# Patient Record
Sex: Male | Born: 1935 | Race: White | Hispanic: No | Marital: Married | Smoking: Former smoker
Health system: Southern US, Community
[De-identification: ages and names within clinical notes are randomized; demographics above are authoritative.]

## PROBLEM LIST (undated history)

## (undated) DIAGNOSIS — G629 Polyneuropathy, unspecified: Secondary | ICD-10-CM

## (undated) DIAGNOSIS — M1990 Unspecified osteoarthritis, unspecified site: Secondary | ICD-10-CM

## (undated) DIAGNOSIS — I2510 Atherosclerotic heart disease of native coronary artery without angina pectoris: Secondary | ICD-10-CM

## (undated) DIAGNOSIS — K649 Unspecified hemorrhoids: Secondary | ICD-10-CM

## (undated) DIAGNOSIS — Z87442 Personal history of urinary calculi: Secondary | ICD-10-CM

## (undated) DIAGNOSIS — I10 Essential (primary) hypertension: Secondary | ICD-10-CM

## (undated) DIAGNOSIS — R238 Other skin changes: Secondary | ICD-10-CM

## (undated) DIAGNOSIS — R233 Spontaneous ecchymoses: Secondary | ICD-10-CM

## (undated) DIAGNOSIS — G4700 Insomnia, unspecified: Secondary | ICD-10-CM

## (undated) DIAGNOSIS — Z8489 Family history of other specified conditions: Secondary | ICD-10-CM

## (undated) DIAGNOSIS — E785 Hyperlipidemia, unspecified: Secondary | ICD-10-CM

## (undated) DIAGNOSIS — Z9289 Personal history of other medical treatment: Secondary | ICD-10-CM

## (undated) DIAGNOSIS — R51 Headache: Secondary | ICD-10-CM

## (undated) DIAGNOSIS — K5900 Constipation, unspecified: Secondary | ICD-10-CM

## (undated) DIAGNOSIS — H409 Unspecified glaucoma: Secondary | ICD-10-CM

## (undated) DIAGNOSIS — K219 Gastro-esophageal reflux disease without esophagitis: Secondary | ICD-10-CM

## (undated) DIAGNOSIS — J40 Bronchitis, not specified as acute or chronic: Secondary | ICD-10-CM

## (undated) HISTORY — PX: CYSTOSCOPY: SUR368

## (undated) HISTORY — PX: CARDIAC CATHETERIZATION: SHX172

## (undated) HISTORY — PX: SIGMOIDOSCOPY: SUR1295

## (undated) HISTORY — PX: CORONARY ANGIOPLASTY: SHX604

## (undated) HISTORY — PX: ESOPHAGOGASTRODUODENOSCOPY (EGD) WITH ESOPHAGEAL DILATION: SHX5812

## (undated) HISTORY — PX: VASECTOMY: SHX75

## (undated) HISTORY — PX: OTHER SURGICAL HISTORY: SHX169

---

## 1990-03-23 HISTORY — PX: OTHER SURGICAL HISTORY: SHX169

## 1990-03-23 HISTORY — PX: CERVICAL DISC SURGERY: SHX588

## 2007-04-22 ENCOUNTER — Ambulatory Visit (HOSPITAL_BASED_OUTPATIENT_CLINIC_OR_DEPARTMENT_OTHER): Admission: RE | Admit: 2007-04-22 | Discharge: 2007-04-22 | Payer: Self-pay | Admitting: Orthopaedic Surgery

## 2009-03-23 HISTORY — PX: OTHER SURGICAL HISTORY: SHX169

## 2010-08-05 NOTE — Op Note (Signed)
NAME:  Gerald Jones, Gerald Jones              ACCOUNT NO.:  [id number]   MEDICAL RECORD NO.:  [id number]          PATIENT TYPE:  AMB   LOCATION:  DSC                          FACILITY:  [facility]   PHYSICIAN:  Claude Manges. Whitfield, M.D.DATE OF BIRTH:  [birth year]   DATE OF PROCEDURE:  04/22/2007  DATE OF DISCHARGE:                               OPERATIVE REPORT   PREOPERATIVE DIAGNOSES:  1. Adhesive capsulitis, left shoulder.  2. Impingement with rotator cuff tear.  3. Degenerative joint disease, acromioclavicular joint.  4. Biceps tendinitis with biceps tear.   POSTOPERATIVE DIAGNOSES:  1. Adhesive capsulitis, left shoulder.  2. Impingement with rotator cuff tear.  3. Degenerative joint disease, acromioclavicular joint.  4. Biceps tendinitis with biceps tear.   PROCEDURES:  1. Manipulation, left shoulder.  2. Arthroscopic debridement, left shoulder joint.  3. Arthroscopic subacromial decompression.  4. Arthroscopic distal clavicle resection.  5. Mini open rotator cuff tear repair with biceps tenodesis.   SURGEON:  Claude Manges. Cleophas Dunker, M.D.   ASSISTANT:  Arlys John D. Petrarca, P.A.-C.   ANESTHESIA:  General with interscalene nerve block.   COMPLICATIONS:  None.   HISTORY:  A 77 year old gentleman has had a miserable time with his  left shoulder over a period of several months.  He has reached a point  where he cannot raise his arm above his head.  He can not sleep and that  side and basically has complete loss of the use of the shoulder because  of his pain.  He has had a recent MRI scan performed in [place],  [place], showing demonstrating a large joint effusion with a large  amount of fluid within the subacromial subdeltoid bursa suggestive of  bursitis, diffuse tendinopathy involving the supraspinatus tendon with  the possibility of a full-thickness tear.  There was also considerable  biceps tendinopathy.  The patient wishes to proceed with arthroscopic  evaluation.   PROCEDURE:  With  the patient comfortable on the operating table and  under general orotracheal anesthesia with a supplemental interscalene  nerve block, the patient was placed in a semi-sitting position with the  shoulder frame.  Examination of the left shoulder revealed considerable  loss of motion, allowing between 90 and 100 degrees of flexion and about  80 degrees of abduction.  Accordingly, manipulation was performed with  obvious lysis of adhesions.  Manipulation was performed in abduction,  flexion, internal and external rotation with near full range of overhead  motion.   The shoulder was then prepped with DuraPrep from the base of neck  circumferentially to below the elbow.  Sterile draping was performed.   A marking pen was used to outline the [facility] joint, the coracoid and the  acromion.  At a point a fingerbreadth posterior and medial to the  posterior angle of the acromion, a small stab wound was made and the  arthroscope easily placed into the shoulder joint.  There was a  hemarthrosis consistent with the manipulation.  There was considerable  synovitis and several longitudinal tears of the biceps tendon, which was  also significantly increased in girth.  There was at least partial  tearing of the cuff and I felt that I could see a small full-thickness  tear.  A second portal was established anteriorly and shaving of the  synovitis and fraying of the glenoid labrum was performed.  There were  some mild chondromalacia changes of the glenoid and the humerus.  Shaving of the partial rotator cuff tear and further synovitis was  performed.   The arthroscope was then placed in the subacromial space posteriorly,  the cannula in the subacromial space anteriorly and a third portal was  established in the lateral subacromial space.  An arthroscopic  subacromial decompression was performed.  There was considerable beefy  red bursal material as well as synovitis of the AC joint.  There was  overhang of  the acromion both laterally and anteriorly as well as at the  [facility] joint.  An anterior-inferior and a lateral acromioplasty was  performed with a 6-mm bur with a very nice decompression.  There was  obvious and significant anterior spurring.  The [facility] joint was obviously  arthritic and a distal clavicle resection was performed with a 6-mm bur  with very nice resection.   A mini open rotator cuff tear repair and biceps tenodesis was then  performed.  The incision was made along the anterior aspect of the  shoulder via sharp dissection and carried down through subcutaneous  tissue.  The deltoid fascia was identified, incised along one of its  raphes and then the fibers separated to visualize the subacromial space.  There was still some remaining bursal tissue, which I resected.  I  thought that the subacromial decompression was adequate.  There was an  obvious rotator cuff tear.  This was elliptically excised.  Beneath that  I could visualize the biceps tendon.  The tendon was tagged and then  incised from its attachment to the superior glenoid.  Under normal  tension, a tendon stump was then sutured with a single Mitek anchor to  the biceps groove.  It was supplemented by placing stitches through the  cuff into the tendon and through the other side of the cuff.  Any  further ragged edges of rotator cuff were then sharply excised and the  rotator cuff tear was then repaired from the humeral head to its apex  superiorly.  We had a nice repair and through a range of motion, there  was no impingement.  The wound was then copiously irrigated with saline  solution.  Deltoid fascia closed with a running 0 Vicryl, the subcu with  0 and 2-0 Vicryl, skin closed with Steri-Strips over Benzoin.  A sterile  bulky dressing was applied, followed by a sling.   PLAN:  Recovery care, discharge in a.m., and hydrocodone.  Office 1  week.      Claude Manges. Cleophas Dunker, M.D.  Electronically Signed     PWW/MEDQ   D:  04/22/2007  T:  04/22/2007  Job:  [id number]

## 2010-12-12 LAB — I-STAT 8, (EC8 V) (CONVERTED LAB)
Bicarbonate: 29.2 — ABNORMAL HIGH
HCT: 44
Hemoglobin: 15
Operator id: 123621
Sodium: 133 — ABNORMAL LOW
TCO2: 31

## 2012-06-10 ENCOUNTER — Other Ambulatory Visit (HOSPITAL_COMMUNITY): Payer: Self-pay | Admitting: Orthopaedic Surgery

## 2012-06-10 ENCOUNTER — Encounter (HOSPITAL_COMMUNITY): Payer: Self-pay | Admitting: *Deleted

## 2012-06-10 NOTE — Progress Notes (Signed)
Dr.Katlaba and [facility] with [facility] in [place] [phone]  Stress test done around 2008  Several heart cath's done with most recent one in 2009  Echo done 04/18/12-requested report  EKG requested from [facility]  Medical Md is Madelyn Flavors with [facility] [phone]

## 2012-06-10 NOTE — Progress Notes (Signed)
pts wife states that he ate breakfast this morning but right after started vomiting multiple times;office notified--wondering if where he took the Percocet? Dr.Yates and staff to follow up
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
## 2012-06-10 NOTE — Progress Notes (Signed)
Requested orders from [place] at [facility] office

## 2012-06-12 MED ORDER — CEFAZOLIN SODIUM-DEXTROSE 2-3 GM-% IV SOLR
2.0000 g | INTRAVENOUS | Status: DC
  Filled 2012-06-12: qty 50

## 2012-06-13 ENCOUNTER — Encounter (HOSPITAL_COMMUNITY)
Admission: RE | Disposition: A | Discharge status: Home / Self Care | Payer: Self-pay | Source: Ambulatory Visit | Attending: Orthopaedic Surgery

## 2012-06-13 ENCOUNTER — Ambulatory Visit (HOSPITAL_COMMUNITY): Payer: Medicare Other | Admitting: Anesthesiology

## 2012-06-13 ENCOUNTER — Ambulatory Visit (HOSPITAL_COMMUNITY): Payer: Medicare Other

## 2012-06-13 ENCOUNTER — Observation Stay (HOSPITAL_COMMUNITY)
Admission: RE | Admit: 2012-06-13 | Discharge: 2012-06-14 | Disposition: A | Discharge status: Home / Self Care | Payer: Medicare Other | Source: Ambulatory Visit | Attending: Orthopaedic Surgery | Admitting: Orthopaedic Surgery

## 2012-06-13 ENCOUNTER — Encounter (HOSPITAL_COMMUNITY): Payer: Self-pay | Admitting: Anesthesiology

## 2012-06-13 ENCOUNTER — Encounter (HOSPITAL_COMMUNITY): Payer: Self-pay | Admitting: Surgery

## 2012-06-13 DIAGNOSIS — Z01810 Encounter for preprocedural cardiovascular examination: Secondary | ICD-10-CM | POA: Insufficient documentation

## 2012-06-13 DIAGNOSIS — M5126 Other intervertebral disc displacement, lumbar region: Principal | ICD-10-CM | POA: Insufficient documentation

## 2012-06-13 DIAGNOSIS — Z01812 Encounter for preprocedural laboratory examination: Secondary | ICD-10-CM | POA: Insufficient documentation

## 2012-06-13 DIAGNOSIS — Z01818 Encounter for other preprocedural examination: Secondary | ICD-10-CM | POA: Insufficient documentation

## 2012-06-13 HISTORY — DX: Headache: R51

## 2012-06-13 HISTORY — DX: Gastro-esophageal reflux disease without esophagitis: K21.9

## 2012-06-13 HISTORY — DX: Family history of other specified conditions: Z84.89

## 2012-06-13 HISTORY — DX: Unspecified osteoarthritis, unspecified site: M19.90

## 2012-06-13 HISTORY — DX: Unspecified hemorrhoids: K64.9

## 2012-06-13 HISTORY — DX: Spontaneous ecchymoses: R23.3

## 2012-06-13 HISTORY — DX: Personal history of other medical treatment: Z92.89

## 2012-06-13 HISTORY — DX: Constipation, unspecified: K59.00

## 2012-06-13 HISTORY — DX: Other skin changes: R23.8

## 2012-06-13 HISTORY — DX: Insomnia, unspecified: G47.00

## 2012-06-13 HISTORY — PX: LUMBAR LAMINECTOMY: SHX95

## 2012-06-13 HISTORY — DX: Atherosclerotic heart disease of native coronary artery without angina pectoris: I25.10

## 2012-06-13 HISTORY — DX: Unspecified glaucoma: H40.9

## 2012-06-13 HISTORY — DX: Bronchitis, not specified as acute or chronic: J40

## 2012-06-13 HISTORY — DX: Polyneuropathy, unspecified: G62.9

## 2012-06-13 HISTORY — DX: Personal history of urinary calculi: Z87.442

## 2012-06-13 HISTORY — DX: Essential (primary) hypertension: I10

## 2012-06-13 HISTORY — DX: Hyperlipidemia, unspecified: E78.5

## 2012-06-13 LAB — COMPREHENSIVE METABOLIC PANEL
ALT: 18 U/L (ref 0–53)
BUN: 6 mg/dL (ref 6–23)
CO2: 28 mEq/L (ref 19–32)
Calcium: 9.3 mg/dL (ref 8.4–10.5)
Creatinine, Ser: 0.82 mg/dL (ref 0.50–1.35)
GFR calc Af Amer: >90 mL/min (ref >90)
GFR calc non Af Amer: 84 mL/min — ABNORMAL LOW (ref >90)
Glucose, Bld: 94 mg/dL (ref 70–99)
Total Protein: 7 g/dL (ref 6.0–8.3)

## 2012-06-13 LAB — CBC
MCV: 81.4 fL (ref 78.0–100.0)
Platelets: 122 10*3/uL — ABNORMAL LOW (ref 150–400)
RBC: 4.3 MIL/uL (ref 4.22–5.81)
RDW: 13.1 % (ref 11.5–15.5)
WBC: 5.4 10*3/uL (ref 4.0–10.5)

## 2012-06-13 LAB — PROTIME-INR: Prothrombin Time: 13.2 seconds (ref 11.6–15.2)

## 2012-06-13 SURGERY — MICRODISCECTOMY LUMBAR LAMINECTOMY
Anesthesia: General | Site: Back | Laterality: Right | Wound class: Clean

## 2012-06-13 MED ORDER — ACETAMINOPHEN 325 MG PO TABS
650.0000 mg | ORAL_TABLET | ORAL | Status: DC | PRN
  Administered 2012-06-13 – 2012-06-14 (×2): 650 mg via ORAL
  Filled 2012-06-13 (×2): qty 2

## 2012-06-13 MED ORDER — THROMBIN 20000 UNITS EX SOLR
CUTANEOUS | Status: DC | PRN
  Administered 2012-06-13: 14:00:00 via TOPICAL

## 2012-06-13 MED ORDER — PROMETHAZINE HCL 25 MG/ML IJ SOLN: 6.2500 mg | INTRAMUSCULAR | Status: DC | PRN

## 2012-06-13 MED ORDER — SODIUM CHLORIDE 0.9 % IJ SOLN
3.0000 mL | Freq: Two times a day (BID) | INTRAMUSCULAR | Status: DC
  Administered 2012-06-13: 3 mL via INTRAVENOUS

## 2012-06-13 MED ORDER — MENTHOL 3 MG MT LOZG: 1.0000 | LOZENGE | OROMUCOSAL | Status: DC | PRN

## 2012-06-13 MED ORDER — MORPHINE SULFATE 2 MG/ML IJ SOLN: 1.0000 mg | INTRAMUSCULAR | Status: DC | PRN

## 2012-06-13 MED ORDER — SENNOSIDES-DOCUSATE SODIUM 8.6-50 MG PO TABS: 1.0000 | ORAL_TABLET | Freq: Every evening | ORAL | Status: DC | PRN

## 2012-06-13 MED ORDER — MUPIROCIN CALCIUM 2 % EX CREA: TOPICAL_CREAM | Freq: Two times a day (BID) | CUTANEOUS | Status: DC

## 2012-06-13 MED ORDER — 0.9 % SODIUM CHLORIDE (POUR BTL) OPTIME
TOPICAL | Status: DC | PRN
  Administered 2012-06-13: 1000 mL

## 2012-06-13 MED ORDER — GLYCOPYRROLATE 0.2 MG/ML IJ SOLN
INTRAMUSCULAR | Status: DC | PRN
  Administered 2012-06-13: .6 mg via INTRAVENOUS

## 2012-06-13 MED ORDER — ASPIRIN 81 MG PO TABS
81.0000 mg | ORAL_TABLET | Freq: Every day | ORAL | Status: DC
  Administered 2012-06-13: 81 mg via ORAL
  Filled 2012-06-13 (×2): qty 1

## 2012-06-13 MED ORDER — LACTATED RINGERS IV SOLN
INTRAVENOUS | Status: DC | PRN
  Administered 2012-06-13: 12:00:00 via INTRAVENOUS

## 2012-06-13 MED ORDER — ONDANSETRON HCL 4 MG/2ML IJ SOLN
INTRAMUSCULAR | Status: DC | PRN
  Administered 2012-06-13: 4 mg via INTRAVENOUS

## 2012-06-13 MED ORDER — PHENOL 1.4 % MT LIQD: 1.0000 | OROMUCOSAL | Status: DC | PRN

## 2012-06-13 MED ORDER — ARTIFICIAL TEARS OP OINT
TOPICAL_OINTMENT | OPHTHALMIC | Status: DC | PRN
  Administered 2012-06-13: 1 via OPHTHALMIC

## 2012-06-13 MED ORDER — METHOCARBAMOL 100 MG/ML IJ SOLN: 500.0000 mg | Freq: Four times a day (QID) | INTRAVENOUS | Status: DC | PRN

## 2012-06-13 MED ORDER — CEFAZOLIN SODIUM-DEXTROSE 2-3 GM-% IV SOLR
2.0000 g | INTRAVENOUS | Status: AC
  Administered 2012-06-13: 2 g via INTRAVENOUS

## 2012-06-13 MED ORDER — SODIUM CHLORIDE 0.9 % IJ SOLN: 3.0000 mL | INTRAMUSCULAR | Status: DC | PRN

## 2012-06-13 MED ORDER — ACETAMINOPHEN 10 MG/ML IV SOLN
INTRAVENOUS | Status: AC
  Administered 2012-06-13: 1000 mg via INTRAVENOUS
  Filled 2012-06-13: qty 100

## 2012-06-13 MED ORDER — TRAMADOL HCL 50 MG PO TABS
50.0000 mg | ORAL_TABLET | Freq: Four times a day (QID) | ORAL | Status: DC | PRN
  Filled 2012-06-13: qty 1

## 2012-06-13 MED ORDER — ACETAMINOPHEN 650 MG RE SUPP: 650.0000 mg | RECTAL | Status: DC | PRN

## 2012-06-13 MED ORDER — ONDANSETRON HCL 4 MG/2ML IJ SOLN: 4.0000 mg | INTRAMUSCULAR | Status: DC | PRN

## 2012-06-13 MED ORDER — BISACODYL 10 MG RE SUPP: 10.0000 mg | Freq: Every day | RECTAL | Status: DC | PRN

## 2012-06-13 MED ORDER — PANTOPRAZOLE SODIUM 40 MG PO TBEC
40.0000 mg | DELAYED_RELEASE_TABLET | Freq: Every day | ORAL | Status: DC
  Administered 2012-06-13: 40 mg via ORAL
  Filled 2012-06-13: qty 1

## 2012-06-13 MED ORDER — WHITE PETROLATUM GEL
Status: AC
  Administered 2012-06-13: 20:00:00
  Filled 2012-06-13: qty 5

## 2012-06-13 MED ORDER — CEFAZOLIN SODIUM 1-5 GM-% IV SOLN
1.0000 g | Freq: Three times a day (TID) | INTRAVENOUS | Status: AC
  Administered 2012-06-13 – 2012-06-14 (×2): 1 g via INTRAVENOUS
  Filled 2012-06-13 (×2): qty 50

## 2012-06-13 MED ORDER — MUPIROCIN 2 % EX OINT
TOPICAL_OINTMENT | CUTANEOUS | Status: AC
  Administered 2012-06-13: 11:00:00
  Filled 2012-06-13: qty 22

## 2012-06-13 MED ORDER — PSYLLIUM 95 % PO PACK
1.0000 | PACK | Freq: Every day | ORAL | Status: DC | PRN
  Filled 2012-06-13: qty 1

## 2012-06-13 MED ORDER — BUPIVACAINE HCL (PF) 0.25 % IJ SOLN
INTRAMUSCULAR | Status: DC | PRN
  Administered 2012-06-13: 10 mL

## 2012-06-13 MED ORDER — LISINOPRIL 40 MG PO TABS
40.0000 mg | ORAL_TABLET | Freq: Every day | ORAL | Status: DC
  Administered 2012-06-13: 40 mg via ORAL
  Filled 2012-06-13 (×2): qty 1

## 2012-06-13 MED ORDER — ACETAMINOPHEN 10 MG/ML IV SOLN: 1000.0000 mg | Freq: Once | INTRAVENOUS | Status: DC | PRN

## 2012-06-13 MED ORDER — FLEET ENEMA 7-19 GM/118ML RE ENEM: 1.0000 | ENEMA | Freq: Once | RECTAL | Status: AC | PRN

## 2012-06-13 MED ORDER — MIDAZOLAM HCL 2 MG/2ML IJ SOLN
INTRAMUSCULAR | Status: AC
  Filled 2012-06-13: qty 2

## 2012-06-13 MED ORDER — KCL IN DEXTROSE-NACL 20-5-0.45 MEQ/L-%-% IV SOLN
INTRAVENOUS | Status: DC
  Filled 2012-06-13 (×3): qty 1000

## 2012-06-13 MED ORDER — ZOLPIDEM TARTRATE 5 MG PO TABS: 5.0000 mg | ORAL_TABLET | Freq: Every evening | ORAL | Status: DC | PRN

## 2012-06-13 MED ORDER — FENTANYL CITRATE 0.05 MG/ML IJ SOLN
INTRAMUSCULAR | Status: DC | PRN
  Administered 2012-06-13: 50 ug via INTRAVENOUS
  Administered 2012-06-13: 100 ug via INTRAVENOUS

## 2012-06-13 MED ORDER — MIDAZOLAM HCL 5 MG/5ML IJ SOLN
INTRAMUSCULAR | Status: DC | PRN
  Administered 2012-06-13: 2 mg via INTRAVENOUS

## 2012-06-13 MED ORDER — BUPIVACAINE HCL (PF) 0.25 % IJ SOLN
INTRAMUSCULAR | Status: AC
  Filled 2012-06-13: qty 30

## 2012-06-13 MED ORDER — ROCURONIUM BROMIDE 100 MG/10ML IV SOLN
INTRAVENOUS | Status: DC | PRN
  Administered 2012-06-13: 40 mg via INTRAVENOUS

## 2012-06-13 MED ORDER — KETOROLAC TROMETHAMINE 30 MG/ML IJ SOLN
30.0000 mg | Freq: Three times a day (TID) | INTRAMUSCULAR | Status: DC
  Administered 2012-06-13 – 2012-06-14 (×3): 30 mg via INTRAVENOUS
  Filled 2012-06-13 (×5): qty 1

## 2012-06-13 MED ORDER — KETOROLAC TROMETHAMINE 30 MG/ML IJ SOLN
INTRAMUSCULAR | Status: AC
  Filled 2012-06-13: qty 1

## 2012-06-13 MED ORDER — SODIUM CHLORIDE 0.9 % IV SOLN: 250.0000 mL | INTRAVENOUS | Status: DC

## 2012-06-13 MED ORDER — DOCUSATE SODIUM 100 MG PO CAPS
100.0000 mg | ORAL_CAPSULE | Freq: Two times a day (BID) | ORAL | Status: DC
  Administered 2012-06-13: 100 mg via ORAL
  Filled 2012-06-13 (×3): qty 1

## 2012-06-13 MED ORDER — LACTATED RINGERS IV SOLN
INTRAVENOUS | Status: DC
  Administered 2012-06-13: 12:00:00 via INTRAVENOUS

## 2012-06-13 MED ORDER — THROMBIN 20000 UNITS EX SOLR
CUTANEOUS | Status: AC
  Filled 2012-06-13: qty 20000

## 2012-06-13 MED ORDER — LATANOPROST 0.005 % OP SOLN
1.0000 [drp] | Freq: Every day | OPHTHALMIC | Status: DC
  Administered 2012-06-13: 1 [drp] via OPHTHALMIC
  Filled 2012-06-13: qty 2.5

## 2012-06-13 MED ORDER — PROPOFOL 10 MG/ML IV BOLUS
INTRAVENOUS | Status: DC | PRN
  Administered 2012-06-13: 100 mg via INTRAVENOUS

## 2012-06-13 MED ORDER — HYDRALAZINE HCL 20 MG/ML IJ SOLN: 5.0000 mg | INTRAMUSCULAR | Status: DC | PRN

## 2012-06-13 MED ORDER — KCL IN DEXTROSE-NACL 20-5-0.45 MEQ/L-%-% IV SOLN
INTRAVENOUS | Status: AC
  Filled 2012-06-13: qty 1000

## 2012-06-13 MED ORDER — NEOSTIGMINE METHYLSULFATE 1 MG/ML IJ SOLN
INTRAMUSCULAR | Status: DC | PRN
  Administered 2012-06-13: 3 mg via INTRAVENOUS

## 2012-06-13 MED ORDER — LIDOCAINE HCL (CARDIAC) 20 MG/ML IV SOLN
INTRAVENOUS | Status: DC | PRN
  Administered 2012-06-13: 50 mg via INTRAVENOUS

## 2012-06-13 MED ORDER — METHOCARBAMOL 500 MG PO TABS
500.0000 mg | ORAL_TABLET | Freq: Four times a day (QID) | ORAL | Status: DC | PRN
  Administered 2012-06-13: 500 mg via ORAL
  Filled 2012-06-13: qty 1

## 2012-06-13 SURGICAL SUPPLY — 48 items
BUR ROUND FLUTED 4 SOFT TCH (BURR) ×2 IMPLANT
CLOTH BEACON ORANGE TIMEOUT ST (SAFETY) ×2 IMPLANT
CORDS BIPOLAR (ELECTRODE) ×2 IMPLANT
COVER SURGICAL LIGHT HANDLE (MISCELLANEOUS) ×2 IMPLANT
DERMABOND ADHESIVE PROPEN (GAUZE/BANDAGES/DRESSINGS) ×1
DERMABOND ADVANCED (GAUZE/BANDAGES/DRESSINGS) ×1
DERMABOND ADVANCED .7 DNX12 (GAUZE/BANDAGES/DRESSINGS) ×1 IMPLANT
DERMABOND ADVANCED .7 DNX6 (GAUZE/BANDAGES/DRESSINGS) ×1 IMPLANT
DRAPE MICROSCOPE LEICA (MISCELLANEOUS) ×2 IMPLANT
DRAPE PROXIMA HALF (DRAPES) ×4 IMPLANT
DRSG EMULSION OIL 3X3 NADH (GAUZE/BANDAGES/DRESSINGS) ×2 IMPLANT
DRSG MEPILEX BORDER 4X4 (GAUZE/BANDAGES/DRESSINGS) IMPLANT
DRSG MEPILEX BORDER 4X8 (GAUZE/BANDAGES/DRESSINGS) ×2 IMPLANT
DURAPREP 26ML APPLICATOR (WOUND CARE) ×2 IMPLANT
ELECT CAUTERY BLADE 6.4 (BLADE) ×2 IMPLANT
ELECT REM PT RETURN 9FT ADLT (ELECTROSURGICAL) ×2
ELECTRODE REM PT RTRN 9FT ADLT (ELECTROSURGICAL) ×1 IMPLANT
GLOVE BIOGEL PI IND STRL 7.5 (GLOVE) ×1 IMPLANT
GLOVE BIOGEL PI IND STRL 8 (GLOVE) ×1 IMPLANT
GLOVE BIOGEL PI INDICATOR 7.5 (GLOVE) ×1
GLOVE BIOGEL PI INDICATOR 8 (GLOVE) ×1
GLOVE ECLIPSE 7.0 STRL STRAW (GLOVE) ×2 IMPLANT
GLOVE ORTHO TXT STRL SZ7.5 (GLOVE) ×2 IMPLANT
GOWN PREVENTION PLUS LG XLONG (DISPOSABLE) ×2 IMPLANT
GOWN STRL NON-REIN LRG LVL3 (GOWN DISPOSABLE) ×2 IMPLANT
KIT BASIN OR (CUSTOM PROCEDURE TRAY) ×2 IMPLANT
KIT ROOM TURNOVER OR (KITS) ×2 IMPLANT
MANIFOLD NEPTUNE II (INSTRUMENTS) ×2 IMPLANT
NDL SUT .5 MAYO 1.404X.05X (NEEDLE) ×1 IMPLANT
NEEDLE HYPO 25GX1X1/2 BEV (NEEDLE) ×2 IMPLANT
NEEDLE MAYO TAPER (NEEDLE) ×1
NEEDLE SPNL 18GX3.5 QUINCKE PK (NEEDLE) ×2 IMPLANT
NS IRRIG 1000ML POUR BTL (IV SOLUTION) ×2 IMPLANT
PACK LAMINECTOMY ORTHO (CUSTOM PROCEDURE TRAY) ×2 IMPLANT
PAD ARMBOARD 7.5X6 YLW CONV (MISCELLANEOUS) ×4 IMPLANT
PATTIES SURGICAL .5 X.5 (GAUZE/BANDAGES/DRESSINGS) ×2 IMPLANT
PATTIES SURGICAL .75X.75 (GAUZE/BANDAGES/DRESSINGS) ×2 IMPLANT
SPONGE GAUZE 4X4 12PLY (GAUZE/BANDAGES/DRESSINGS) ×2 IMPLANT
SUT VIC AB 2-0 CT1 27 (SUTURE) ×1
SUT VIC AB 2-0 CT1 TAPERPNT 27 (SUTURE) ×1 IMPLANT
SUT VICRYL 0 TIES 12 18 (SUTURE) IMPLANT
SUT VICRYL 4-0 PS2 18IN ABS (SUTURE) ×2 IMPLANT
SUT VICRYL AB 2 0 TIES (SUTURE) IMPLANT
SYR 20ML ECCENTRIC (SYRINGE) IMPLANT
SYR CONTROL 10ML LL (SYRINGE) ×2 IMPLANT
TOWEL OR 17X24 6PK STRL BLUE (TOWEL DISPOSABLE) ×2 IMPLANT
TOWEL OR 17X26 10 PK STRL BLUE (TOWEL DISPOSABLE) ×2 IMPLANT
WATER STERILE IRR 1000ML POUR (IV SOLUTION) ×2 IMPLANT

## 2012-06-13 NOTE — Transfer of Care (Signed)
Immediate Anesthesia Transfer of Care Note  Patient: Gerald Jones  Procedure(s) Performed: Procedure(s) with comments: MICRODISCECTOMY LUMBAR LAMINECTOMY (Right) - Right L3-4 Microdiscectomy  Patient Location: PACU  Anesthesia Type:General  Level of Consciousness: sedated  Airway & Oxygen Therapy: Patient Spontanous Breathing and Patient connected to face mask oxygen  Post-op Assessment: Report given to PACU RN  Post vital signs: Reviewed and stable  Complications: No apparent anesthesia complications

## 2012-06-13 NOTE — Interval H&P Note (Signed)
History and Physical Interval Note:  06/13/2012 12:40 PM  Gerald Jones  has presented today for surgery, with the diagnosis of L3-4 HNP (herniated nucleus pulposus) Free Fragment  The various methods of treatment have been discussed with the patient and family. After consideration of risks, benefits and other options for treatment, the patient has consented to  Procedure(s) with comments: MICRODISCECTOMY LUMBAR LAMINECTOMY (Right) - Right L3-4 Microdiscectomy as a surgical intervention .  The patient's history has been reviewed, patient examined, no change in status, stable for surgery.  I have reviewed the patient's chart and labs.  Questions were answered to the patient's satisfaction.     YATES,MARK C

## 2012-06-13 NOTE — Progress Notes (Signed)
Dr. Viola Kinnick Piper notified that patient unable to travel in [facility] to xray he states pt does not need cxr unless he has CHF or active disease such as pneumonia unless Dr. Ophelia Charter ordered one and in that case he will need a portable cxr. Dr. Ophelia Charter ordered the cxr so xray changed to stat portable.

## 2012-06-13 NOTE — Preoperative (Signed)
Beta Blockers   Reason not to administer Beta Blockers:Not Applicable 

## 2012-06-13 NOTE — Op Note (Signed)
NAMEVIRGAL, WARMUTH              ACCOUNT NO.:  [id number]  MEDICAL RECORD NO.:  [id number]  LOCATION:  [id number]                        FACILITY:  [facility]  PHYSICIAN:  Jenilee Franey C. Ophelia Charter, M.D.    DATE OF BIRTH:  [birth year]  DATE OF PROCEDURE:  06/13/2012 DATE OF DISCHARGE:                              OPERATIVE REPORT   PREOPERATIVE DIAGNOSIS:  Right L3-4 herniated nucleus pulposus with free fragment and radiculopathy.  POSTOPERATIVE DIAGNOSIS:  Right L3-4 herniated nucleus pulposus with free fragment and radiculopathy.  PROCEDURE:  Right L3 hemilaminectomy, microdiskectomy, and removal of free fragments.  SURGEON:  Masyn Fullam C. Ophelia Charter, M.D.  ASSISTANT:  Maud Deed, PA-C, medically necessary and present for the entire procedure.  ANESTHESIA:  GOT plus Marcaine skin local.  ESTIMATED BLOOD LOSS:  100 mL.  DRAINS:  None.  FINDINGS:  Disk herniation into the lateral recess extending up to the pedicle of L3 with severe lateral recess stenosis and foraminal stenosis.  PROCEDURE:  After induction of general anesthesia, the patient was placed prone on chest rolls with careful padding over the axillary nerve, shoulders, rolled yellow sponges underneath the shoulders, calf bumpers, preoperative Ancef, prophylaxis.  Back was prepped with DuraPrep.  The area was squared with towels, Betadine, Steri-Drape applied, and laminectomy sheet and draped.  Time-out procedure was completed.  The patient had problems with disk herniation, leg giving way, was falling, could not stand, could not sit, has been having pain for the last 3 weeks.  He had morphine injections in local clinic and urgent care Dilaudid, Percocet, Vicodin, Medrol Dosepak, all without relief. MRI scan showed disk herniation with L3 and L4 nerve root compression with the disk fragment as described above.  He refused epidural steroid injections and requested immediate surgery for relief of his pain that he rated  10/10.  PROCEDURE:  Based on palpable landmarks, small incision was made 2 to 3 mm to the right of midline.  Dissection down to the inferior aspect of L3 lamina and a Kocher clamp was placed over the top of spinous process midway up the lamina where it should be sitting directly over the L3-4 disk space at the inferior aspect of the disk herniation free fragment level.  This was confirmed with cross-table lateral x-ray.  Lamina was thinned with a bur.  Operative microscope was draped, brought in.  The bone was marked with a marking pen as soon as the x-ray was confirmed and once laminotomy was done, it was extended proximally to complete right L3 hemilaminectomy leaving the spinous process and moving bone out to the level of the pedicle with care taken to make sure that the pars was not disturbed.  Thick chunks of ligament were removed.  Dura was very tight and lateral gutter exposed the disk and passes were made. Initially only a Therapist, nutritional could be used instead of a D'Errico due to the tightness from the free fragment.  There was some venous bleeders, small amount of prothrombin soaked Gelfoam and patty was used. Passes were made with up and down micropituitary and then a piece was teased from just cephalad that was out in a pocket, this was teased and pulled out.  This allowed  D'Errico to be inserted and then using the black nerve hook, fragments of disks were teased out, caught.  There was a capsule, which had some veins which were coagulated with the bipolar. Once capsule was opened, multiple fragments of disk from the free fragment were removed until the capsules completely cleaned out, and the posterior aspect of the vertebral body of L3 was well visualized. Lateral gutter was clear.  This was followed up to the level of the pedicle.  Pedicle was easily palpated.  Nerve root slipping out underneath the pedicle was well visualized.  Nerve root was free. Hockey stick could make  180-degree pass anterior to the dura.  Some thrombin Gelfoam was placed in the pocket, waited, and exchanged in 1-2 minutes with patties placed on top and all Gelfoam was removed. Operative field was acceptably dry.  There was minimal amount of epidural oozing.  Fascia was reapproximated with #1 Vicryl, 2-0 Vicryl in the subcutaneous tissue, subcuticular skin closure, postop dressing, and transferred to the recovery room.  Instrument count and needle count was correct.  The patient tolerated the procedure.     Braylyn Eye C. Ophelia Charter, M.D.     MCY/MEDQ  D:  06/13/2012  T:  06/13/2012  Job:  [id number]

## 2012-06-13 NOTE — Brief Op Note (Cosign Needed)
06/13/2012  2:32 PM  PATIENT:  Carlyle Basques  79 y.o. male  PRE-OPERATIVE DIAGNOSIS:  L3-4 HNP (herniated nucleus pulposus) Free Fragment  POST-OPERATIVE DIAGNOSIS:  L3-4 HNP (herniated nucleus pulposus) Free Fragment  PROCEDURE:  Procedure(s) with comments: MICRODISCECTOMY LUMBAR LAMINECTOMY (Right) - Right L3-4 Microdiscectomy  SURGEON:  Surgeon(s) and Role:    * Eldred Manges, MD - Primary  PHYSICIAN ASSISTANT: Maud Deed PAC  ASSISTANTS: none   ANESTHESIA:   general  EBL:  Total I/O In: 750 [I.V.:750] Out: 100 [Blood:100]  BLOOD ADMINISTERED:none  DRAINS: none   LOCAL MEDICATIONS USED:  MARCAINE     SPECIMEN:  No Specimen  DISPOSITION OF SPECIMEN:  N/A  COUNTS:  YES  TOURNIQUET:  * No tourniquets in log *  DICTATION: .Note written in EPIC  PLAN OF CARE: Admit for overnight observation  PATIENT DISPOSITION:  PACU - hemodynamically stable.   Delay start of Pharmacological VTE agent (>24hrs) due to surgical blood loss or risk of bleeding: yes

## 2012-06-13 NOTE — Anesthesia Preprocedure Evaluation (Addendum)
Anesthesia Evaluation  Patient identified by MRN, date of birth, ID band Patient awake    Reviewed: Allergy & Precautions, H&P , NPO status , Patient's Chart, lab work & pertinent test results  History of Anesthesia Complications Negative for: history of anesthetic complications  Airway Mallampati: II TM Distance: >3 FB Neck ROM: Full    Dental  (+) Dental Advisory Given, Edentulous Upper and Edentulous Lower   Pulmonary neg pulmonary ROS,  breath sounds clear to auscultation  Pulmonary exam normal       Cardiovascular hypertension, Pt. on medications + CAD negative cardio ROS  Rhythm:Regular Rate:Normal     Neuro/Psych  Headaches, negative psych ROS   GI/Hepatic Neg liver ROS, GERD-  ,  Endo/Other  negative endocrine ROS  Renal/GU negative Renal ROS     Musculoskeletal negative musculoskeletal ROS (+)   Abdominal   Peds  Hematology negative hematology ROS (+)   Anesthesia Other Findings   Reproductive/Obstetrics                        Anesthesia Physical Anesthesia Plan  ASA: III  Anesthesia Plan: General   Post-op Pain Management:    Induction: Intravenous  Airway Management Planned: Oral ETT  Additional Equipment:   Intra-op Plan:   Post-operative Plan: Extubation in OR  Informed Consent: I have reviewed the patients History and Physical, chart, labs and discussed the procedure including the risks, benefits and alternatives for the proposed anesthesia with the patient or authorized representative who has indicated his/her understanding and acceptance.   Dental advisory given and Dental Advisory Given  Plan Discussed with: CRNA and Anesthesiologist  Anesthesia Plan Comments:        Anesthesia Quick Evaluation

## 2012-06-13 NOTE — Anesthesia Postprocedure Evaluation (Signed)
Anesthesia Post Note  Patient: Gerald Jones  Procedure(s) Performed: Procedure(s) (LRB): MICRODISCECTOMY LUMBAR LAMINECTOMY (Right)  Anesthesia type: General  Patient location: PACU  Post pain: Pain level controlled  Post assessment: Post-op Vital signs reviewed  Last Vitals: BP 182/71  Pulse 77  Temp(Src) 36.3 C (Oral)  Resp 16  Ht 5\' 5"  (1.651 m)  Wt 150 lb (68.04 kg)  BMI 24.96 kg/m2  SpO2 100%  Post vital signs: Reviewed  Level of consciousness: sedated  Complications: No apparent anesthesia complications

## 2012-06-14 ENCOUNTER — Encounter (HOSPITAL_COMMUNITY): Payer: Self-pay | Admitting: Orthopaedic Surgery

## 2012-06-14 NOTE — Discharge Summary (Signed)
Physician Discharge Summary  Patient ID: Gerald Jones MRN: [id number] DOB/AGE: 77-Sep-1935 77 y.o.  Admit date: 06/13/2012 Discharge date: 06/14/2012  Admission Diagnoses:right L3-4 HNP with free fragment and radiculopathy  Discharge Diagnoses: same Principal Problem:   HNP (herniated nucleus pulposus), lumbar   Discharged Condition: good  Hospital Course: underwent right L3 hemilaminectomy with removal of free fragment with good relief or radiculopathy. Ambulatory and discharged the following morning.   Consults: none Significant Diagnostic Studies:   Treatments: surgery: as above  Discharge Exam: Blood pressure 149/54, pulse 54, temperature 98.7 F (37.1 C), temperature source Oral, resp. rate 18, height 5\' 5"  (1.651 m), weight 68.04 kg (150 lb), SpO2 99.00%. Incision/Wound:clean and dry.  Good quad strength, ambulatory, relief of leg pain and weakness  Disposition: Final discharge disposition not confirmed     Medication List    TAKE these medications       acetaminophen 500 MG tablet  Commonly known as:  TYLENOL  Take 500-1,000 mg by mouth every 6 (six) hours as needed for pain.     aspirin 81 MG tablet  Take 81 mg by mouth daily.     [employer] SLEEP AID 50 MG Caps  Generic drug:  DiphenhydrAMINE HCl (Sleep)  Take 50 mg by mouth at bedtime as needed (for sleep).     fish oil-omega-3 fatty acids 1000 MG capsule  Take 1 g by mouth daily.     latanoprost 0.005 % ophthalmic solution  Commonly known as:  XALATAN  Place 1 drop into both eyes at bedtime.     lisinopril 40 MG tablet  Commonly known as:  PRINIVIL,ZESTRIL  Take 40 mg by mouth daily.     omeprazole 20 MG capsule  Commonly known as:  PRILOSEC  Take 20 mg by mouth daily.     psyllium 95 % Pack  Commonly known as:  HYDROCIL/METAMUCIL  Take 1 packet by mouth daily as needed.           Follow-up Information   Follow up with Eldred Manges, MD. Schedule an appointment as soon as possible for a visit in  9 days. (see in [place] clinic on Thursday)    Contact information:   [address] [place] [address] [phone]       Signed: Eldred Manges 06/14/2012, 7:30 AM

## 2012-06-14 NOTE — Progress Notes (Signed)
Patient discharged to home via wheelchair in stable conditions. Discharge instructions were given and explained.

## 2014-09-18 IMAGING — CR DG LUMBAR SPINE 1V
1 series · 1 of 1 positions shown · non-contrast
Comparison: Lumbar spine MRI 06/08/2012

CLINICAL DATA: Microdiskectomy at L3-L4.

LUMBAR SPINE - 1 VIEW

[lateral]
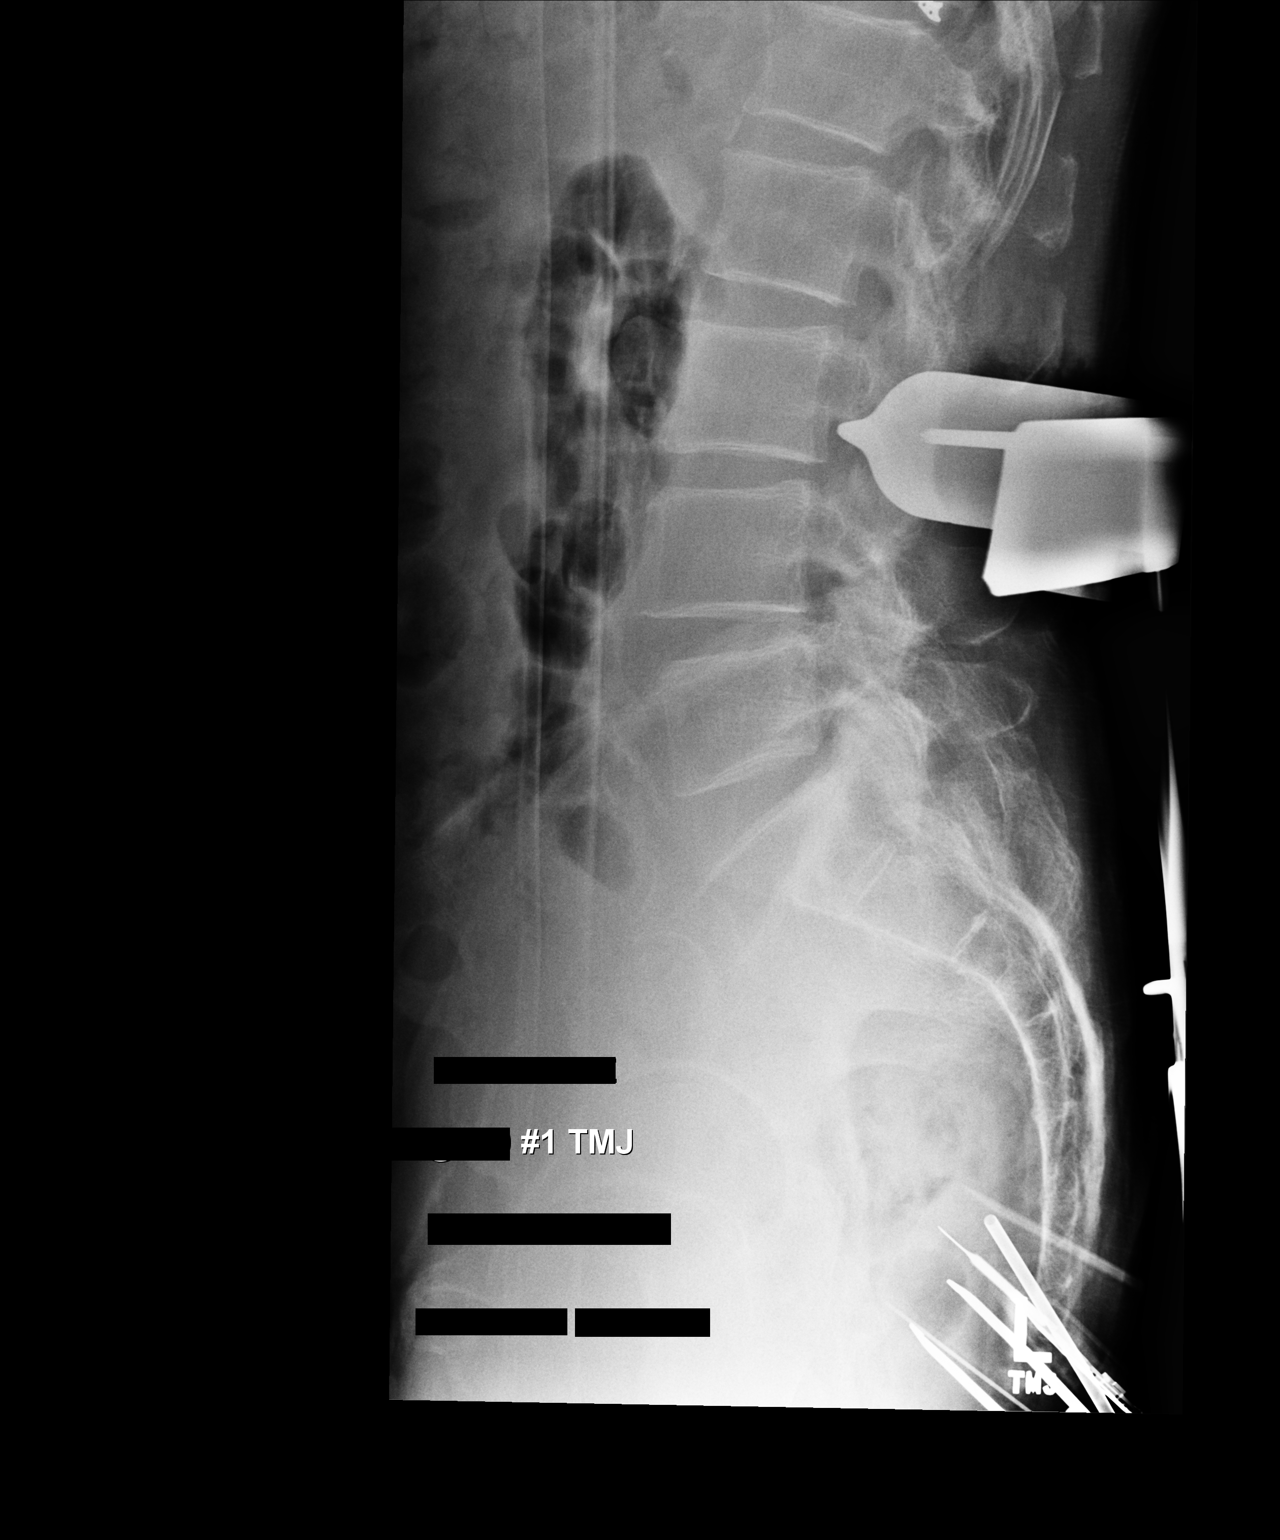

[1 of 1 positions shown; findings below may reference images not displayed]

FINDINGS: The lower aspect of the posterior L3 vertebral body was
marked on this radiograph.  Alignment of the lumbar spine is within
normal limits.
IMPRESSION: Surgical marking near the L3-L4 disc space.

## 2017-06-21 ENCOUNTER — Encounter: Payer: Self-pay | Admitting: "Endocrinology

## 2017-06-21 ENCOUNTER — Encounter (INDEPENDENT_AMBULATORY_CARE_PROVIDER_SITE_OTHER): Payer: Self-pay

## 2017-06-21 ENCOUNTER — Ambulatory Visit (INDEPENDENT_AMBULATORY_CARE_PROVIDER_SITE_OTHER): Payer: Medicare Other | Admitting: "Endocrinology

## 2017-06-21 VITALS — BP 144/77 | HR 52 | Ht 66.0 in | Wt 145.0 lb

## 2017-06-21 DIAGNOSIS — E871 Hypo-osmolality and hyponatremia: Secondary | ICD-10-CM | POA: Diagnosis not present

## 2017-06-21 NOTE — Progress Notes (Signed)
Endocrinology Consult Note                                            06/21/2017, 5:04 PM   Subjective:    Patient ID: Gerald Jones, male    DOB: 04/06/1933, PCP Olen Cordial, MD   Past Medical History:  Diagnosis Date  . Arthritis   . Bronchitis   . Bruises easily   . Constipation    related to pain meds  . Coronary artery disease   . Family history of anesthesia complication    pts son gets amnesia for a few days after surgery  . GERD (gastroesophageal reflux disease)    takes Omeprazole daily  . Glaucoma    uses eye drops nightly  . Headache(784.0)   . Hemorrhoids   . History of blood transfusion    as a baby  . History of kidney stones   . Hyperlipidemia    takes Fish Oil daily--but on hold for surgery  . Hypertension    takes Lisinpril daily  . Insomnia    takes a night time sleep aid  . Peripheral neuropathy    Past Surgical History:  Procedure Laterality Date  . basal cell removed from tip of nose  2011  . CARDIAC CATHETERIZATION  2003/2009  . CERVICAL DISC SURGERY  1992  . CORONARY ANGIOPLASTY     x 2  . CYSTOSCOPY    . ESOPHAGOGASTRODUODENOSCOPY (EGD) WITH ESOPHAGEAL DILATION     x 3  . left shoulder surgery  1992  . left shoulder surgery in 2009    . LUMBAR LAMINECTOMY Right 06/13/2012   Procedure: MICRODISCECTOMY LUMBAR LAMINECTOMY;  Surgeon: Eldred Manges, MD;  Location: [facility];  Service: Orthopedics;  Laterality: Right;  Right L3-4 Microdiscectomy  . SIGMOIDOSCOPY    . teeth extracted  late 60's  . VASECTOMY  late 76's   Social History   Socioeconomic History  . Marital status: Married    Spouse name: Not on file  . Number of children: Not on file  . Years of education: Not on file  . Highest education level: Not on file  Occupational History  . Not on file  Social Needs  . Financial resource strain: Not on file  . Food insecurity:    Worry: Not on file    Inability: Not on file  . Transportation needs:    Medical: Not on  file    Non-medical: Not on file  Tobacco Use  . Smoking status: Former Smoker    Last attempt to quit: 07/01/1988    Years since quitting: 28.9  . Smokeless tobacco: Never Used  . Tobacco comment: quit in April 1990  Substance and Sexual Activity  . Alcohol use: No  . Drug use: No  . Sexual activity: Not Currently  Lifestyle  . Physical activity:    Days per week: Not on file    Minutes per session: Not on file  . Stress: Not on file  Relationships  . Social connections:    Talks on phone: Not on file    Gets together: Not on file    Attends religious service: Not on file    Active member of club or organization: Not on file    Attends meetings of clubs or organizations: Not on file    Relationship status: Not  on file  Other Topics Concern  . Not on file  Social History Narrative  . Not on file   Outpatient Encounter Medications as of 06/21/2017  Medication Sig  . Acetaminophen (ARTHRITIS PAIN RELIEF PO) Take 650 mg by mouth 2 (two) times daily.  Marland Kitchen amLODipine (NORVASC) 5 MG tablet Take 5 mg by mouth daily.  Marland Kitchen aspirin 81 MG tablet Take 81 mg by mouth daily.  . brimonidine (ALPHAGAN) 0.2 % ophthalmic solution 2 (two) times daily.  . clopidogrel (PLAVIX) 75 MG tablet Take 75 mg by mouth daily.  . diphenhydrAMINE (BENADRYL) 25 MG tablet Take 25 mg by mouth at bedtime as needed.  . ezetimibe (ZETIA) 10 MG tablet Take 10 mg by mouth daily.  Marland Kitchen lisinopril (PRINIVIL,ZESTRIL) 20 MG tablet Take 20 mg by mouth daily.   . Multiple Vitamin (MULTIVITAMIN) tablet Take 1 tablet by mouth daily.  . pantoprazole (PROTONIX) 40 MG tablet Take 40 mg by mouth daily.  . pravastatin (PRAVACHOL) 20 MG tablet Every Mon, Wed, Fri  . pregabalin (LYRICA) 75 MG capsule Take 75 mg by mouth 2 (two) times daily.  . [DISCONTINUED] omeprazole (PRILOSEC) 20 MG capsule Take 20 mg by mouth daily.  . [DISCONTINUED] acetaminophen (TYLENOL) 500 MG tablet Take 500-1,000 mg by mouth every 6 (six) hours as needed for  pain.  . [DISCONTINUED] DiphenhydrAMINE HCl, Sleep, ([employer] SLEEP AID) 50 MG CAPS Take 50 mg by mouth at bedtime as needed (for sleep).  . [DISCONTINUED] fish oil-omega-3 fatty acids 1000 MG capsule Take 1 g by mouth daily.  . [DISCONTINUED] latanoprost (XALATAN) 0.005 % ophthalmic solution Place 1 drop into both eyes at bedtime.  . [DISCONTINUED] psyllium (HYDROCIL/METAMUCIL) 95 % PACK Take 1 packet by mouth daily as needed.   No facility-administered encounter medications on file as of 06/21/2017.    ALLERGIES: Allergies  Allergen Reactions  . Amoxicillin     Nausea/weak/hot  . Carvedilol     Slowed heartrate  . Dilaudid [Hydromorphone Hcl] Nausea And Vomiting    Severe nausea and vomiting   . Doxycycline     Reaction unknown  . Fentanyl Nausea And Vomiting  . Hydrocodone     Nausea and vomiting  . Niaspan [Niacin Er]     Hot/sweaty  . Percocet [Oxycodone-Acetaminophen] Nausea And Vomiting  . Plavix [Clopidogrel Bisulfate]     Dark tarry stools  . Prednisone     Hard to breathe and weak  . Tequin [Gatifloxacin]     Reaction unknown    VACCINATION STATUS:  There is no immunization history on file for this patient.  HPI Gerald Jones is 84 y.o. male who presents today with a medical history as above. he is being seen in consultation for hyponatremia requested by Olen Cordial, MD.  -He is accompanied by his elderly wife.  He is a poor historian.  Review of his medical records reveals that he did have hyponatremia since at least 2014, June 13, 2012 his sodium level was 129. -On subsequent years his sodium has improved to maximum of 134, anterior it showed lowest reading of 125 on May 06, 2017. -He does not recall if he was ever offered treatment, other than recommending to increase salt intake. His recent laboratory work from May 06, 2017 reveals urine osmolality of 369, urine sodium of 62, serum osmolality of 260 (normal 273-295), and a.m. cortisol of 2.6, TSH of  2.34.  His sodium was 125, potassium 4.5, calcium 8.4. -He does not have acute complaints today.  He denies headaches, nausea vomiting, diarrhea. -Further detailed interview patient reveals that he consumes 4-6 bottles of water by the urging of his wife to maintain "hydration ".  He also consumes 2-4 bottles of Gatorade, 3-4 cups of coffee. -He denies xerostomia, polyuria, polydipsia. -He denies any history of head injury, malignancy, brain surgery.  Reportedly, he maintains stable weight over the years.  He reports good appetite, and physically active.  He is a former smoker, no history of COPD. -He denies any prior history of steroids requirement on long-term.  Review of Systems  Constitutional: + steady weight , no fatigue, no subjective hyperthermia, no subjective hypothermia Eyes: no blurry vision, no xerophthalmia ENT: no sore throat, no nodules palpated in throat, no dysphagia/odynophagia, no hoarseness Cardiovascular: no Chest Pain, no Shortness of Breath, no palpitations, no leg swelling Respiratory: no cough, no SOB Gastrointestinal: no Nausea/Vomiting/Diarhhea Musculoskeletal: no muscle/joint aches Skin: no rashes Neurological: no tremors, no numbness, no tingling, no dizziness Psychiatric: no depression, no anxiety  Objective:    BP (!) 144/77   Pulse (!) 52   Ht 5\' 6"  (1.676 m)   Wt 145 lb (65.8 kg)   BMI 23.40 kg/m   Wt Readings from Last 3 Encounters:  06/21/17 145 lb (65.8 kg)  06/10/12 150 lb (68 kg)    Physical Exam  Constitutional: + Appropriate weight for height, not in acute distress, normal state of mind Eyes: PERRLA, EOMI, no exophthalmos ENT:  +moist mucous membranes, no thyromegaly, no cervical lymphadenopathy Cardiovascular: normal precordial activity, Regular Rate and Rhythm, no Murmur/Rubs/Gallops Respiratory:  adequate breathing efforts, no gross chest deformity, Clear to auscultation bilaterally Gastrointestinal: abdomen soft, Non -tender, No  distension, Bowel Sounds present Musculoskeletal: + Diffuse arthritic changes in his upper extremities,  strength intact in all four extremities Skin: moist, warm, no rashes Neurological: no tremor with outstretched hands, Deep tendon reflexes normal in all four extremities.  CMP ( most recent) CMP     Component Value Date/Time   NA 129 (L) 06/13/2012 1110   K 4.1 06/13/2012 1110   CL 94 (L) 06/13/2012 1110   CO2 28 06/13/2012 1110   GLUCOSE 94 06/13/2012 1110   BUN 6 06/13/2012 1110   CREATININE 0.82 06/13/2012 1110   CALCIUM 9.3 06/13/2012 1110   PROT 7.0 06/13/2012 1110   ALBUMIN 3.8 06/13/2012 1110   AST 20 06/13/2012 1110   ALT 18 06/13/2012 1110   ALKPHOS 47 06/13/2012 1110   BILITOT 0.7 06/13/2012 1110   GFRNONAA 84 (L) 06/13/2012 1110   GFRAA >90 06/13/2012 1110     Assessment & Plan:   1. Hyponatremia - Gerald BasquesRobert Jones  is being seen at a kind request of Tiburcio PeaHarris, Janann ColonelSydney M, MD. - I have reviewed his available records and clinically evaluated the patient. - Based on reviews, he has subacute to chronic hyponatremia at least since 2014.  His sodium level fluctuated between low of 125 and 134. 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   -The mainstay of therapy for SIADH is fluid restrictions.  I have advised patient to cut free water intake by half, maintain his other fluid intake and repeat CMP in 1 week.  -He also has suboptimal a.m. cortisol of 2.6, suspect for adrenal insufficiency.  He will have ACTH stimulation test to evaluate adrenal sufficiency.  -In asymptomatic euvolemic patients with subacute to chronic hyponatremia, there is a lower hypothalamic osmostat than normal, hence,  correction of sodium to   "normal "range is unnecessary or sometimes complicates the clinical course for the patient.   -These patients function very well with low normal sodium level between 130-135, which will be the treatment target for him. -Suspicion for ectopic ADH is is low at this time.  If he is confirmed to have other secondary endocrine causes of hyponatremia, he will be considered for treatment accordingly. -He is advised to return in 2 weeks for reevaluation. -  I did not initiate any new prescriptions today. - I advised patient to maintain close follow up with Olen Cordial, MD for primary care needs.   - Time spent with the patient: 45 minutes, of which >50% was spent in obtaining information about his symptoms, reviewing his previous labs, evaluations, and treatments, counseling him about his hyponatremia, hypercortisolemia, and developing a plan to confirm the diagnosis and long term treatment as necessary.  Gerald Jones participated in the discussions, expressed understanding, and voiced agreement with the above plans.  All questions were answered to his satisfaction. he is encouraged to contact clinic should he have any questions or concerns prior to his return visit.  Follow up plan: Return in about 2 weeks (around 07/05/2017) for follow up with pre-visit labs, do labs after 06/27/2017.   Marquis Lunch, MD [facility] [address] [place], [place] [address] Phone: [phone]  Fax: [phone]     06/21/2017, 5:04 PM  This note was partially dictated with voice recognition software. Similar sounding words can be transcribed inadequately or may not  be corrected upon review.

## 2017-06-28 ENCOUNTER — Encounter (HOSPITAL_COMMUNITY)
Admission: RE | Admit: 2017-06-28 | Discharge: 2017-06-28 | Disposition: A | Discharge status: Home / Self Care | Payer: Medicare Other | Source: Ambulatory Visit | Attending: "Endocrinology | Admitting: "Endocrinology

## 2017-06-28 DIAGNOSIS — E871 Hypo-osmolality and hyponatremia: Secondary | ICD-10-CM | POA: Insufficient documentation

## 2017-06-28 LAB — COMPREHENSIVE METABOLIC PANEL
ALBUMIN: 4.4 g/dL (ref 3.5–5.0)
ALK PHOS: 50 U/L (ref 38–126)
ALT: 20 U/L (ref 17–63)
ANION GAP: 10 (ref 5–15)
AST: 23 U/L (ref 15–41)
BILIRUBIN TOTAL: 0.8 mg/dL (ref 0.3–1.2)
BUN: 11 mg/dL (ref 6–20)
CALCIUM: 9.3 mg/dL (ref 8.9–10.3)
CO2: 26 mmol/L (ref 22–32)
Chloride: 97 mmol/L — ABNORMAL LOW (ref 101–111)
Creatinine, Ser: 0.83 mg/dL (ref 0.61–1.24)
Glucose, Bld: 84 mg/dL (ref 65–99)
POTASSIUM: 4.1 mmol/L (ref 3.5–5.1)
Sodium: 133 mmol/L — ABNORMAL LOW (ref 135–145)
TOTAL PROTEIN: 7.2 g/dL (ref 6.5–8.1)

## 2017-06-28 LAB — ACTH STIMULATION, 3 TIME POINTS
Cortisol, 30 Min: 18.3 ug/dL
Cortisol, 60 Min: 21 ug/dL
Cortisol, Base: 13.9 ug/dL

## 2017-06-28 LAB — TSH: TSH: 2.698 u[IU]/mL (ref 0.350–4.500)

## 2017-06-28 MED ORDER — COSYNTROPIN 0.25 MG IJ SOLR
0.2500 mg | Freq: Once | INTRAMUSCULAR | Status: AC
  Administered 2017-06-28: 0.25 mg via INTRAVENOUS

## 2017-06-28 MED ORDER — SODIUM CHLORIDE 0.9% FLUSH
INTRAVENOUS | Status: AC
  Filled 2017-06-28: qty 30

## 2017-06-28 MED ORDER — COSYNTROPIN 0.25 MG IJ SOLR
INTRAMUSCULAR | Status: AC
  Filled 2017-06-28: qty 0.25

## 2017-06-29 LAB — T4: T4 TOTAL: 7.7 ug/dL (ref 4.5–12.0)

## 2017-06-30 NOTE — Progress Notes (Signed)
Results for Carlyle BasquesRSONS, Denzell (MRN [id number]) as of 06/30/2017 10:41  Ref. Range 06/28/2017 09:00  Cortisol, Base Latest Units: ug/dL 40.913.9  Cortisol, 30 Min Latest Units: ug/dL 81.118.3  Cortisol, 60 Min Latest Units: ug/dL 21.0

## 2017-07-12 ENCOUNTER — Ambulatory Visit (INDEPENDENT_AMBULATORY_CARE_PROVIDER_SITE_OTHER): Payer: Medicare Other | Admitting: "Endocrinology

## 2017-07-12 ENCOUNTER — Encounter: Payer: Self-pay | Admitting: "Endocrinology

## 2017-07-12 VITALS — BP 143/68 | HR 54 | Ht 66.0 in | Wt 149.0 lb

## 2017-07-12 DIAGNOSIS — E871 Hypo-osmolality and hyponatremia: Secondary | ICD-10-CM | POA: Diagnosis not present

## 2017-07-12 NOTE — Progress Notes (Signed)
Endocrinology follow-up  Note                                            07/12/2017, 4:20 PM   Subjective:    Patient ID: Gerald Jones, male    DOB: 1933/08/23, PCP Talmage Coin, MD   Past Medical History:  Diagnosis Date  . Arthritis   . Bronchitis   . Bruises easily   . Constipation    related to pain meds  . Coronary artery disease   . Family history of anesthesia complication    pts son gets amnesia for a few days after surgery  . GERD (gastroesophageal reflux disease)    takes Omeprazole daily  . Glaucoma    uses eye drops nightly  . Headache(784.0)   . Hemorrhoids   . History of blood transfusion    as a baby  . History of kidney stones   . Hyperlipidemia    takes Fish Oil daily--but on hold for surgery  . Hypertension    takes Lisinpril daily  . Insomnia    takes a night time sleep aid  . Peripheral neuropathy    Past Surgical History:  Procedure Laterality Date  . basal cell removed from tip of nose  2011  . CARDIAC CATHETERIZATION  2003/2009  . [place]  . CORONARY ANGIOPLASTY     x 2  . CYSTOSCOPY    . ESOPHAGOGASTRODUODENOSCOPY (EGD) WITH ESOPHAGEAL DILATION     x 3  . left shoulder surgery  1992  . left shoulder surgery in 2009    . LUMBAR LAMINECTOMY Right 06/13/2012   Procedure: MICRODISCECTOMY LUMBAR LAMINECTOMY;  Surgeon: Marybelle Killings, MD;  Location: [facility];  Service: Orthopedics;  Laterality: Right;  Right L3-4 Microdiscectomy  . SIGMOIDOSCOPY    . teeth extracted  late 60's  . VASECTOMY  late 52's   Social History   Socioeconomic History  . Marital status: Married    Spouse name: Not on file  . Number of children: Not on file  . Years of education: Not on file  . Highest education level: Not on file  Occupational History  . Not on file  Social Needs  . Financial resource strain: Not on file  . Food insecurity:    Worry: Not on file    Inability: Not on file  . Transportation needs:    Medical: Not  on file    Non-medical: Not on file  Tobacco Use  . Smoking status: Former Smoker    Last attempt to quit: 07/01/1988    Years since quitting: 29.0  . Smokeless tobacco: Never Used  . Tobacco comment: quit in April 1990  Substance and Sexual Activity  . Alcohol use: No  . Drug use: No  . Sexual activity: Not Currently  Lifestyle  . Physical activity:    Days per week: Not on file    Minutes per session: Not on file  . Stress: Not on file  Relationships  . Social connections:    Talks on phone: Not on file    Gets together: Not on file    Attends religious service: Not on file    Active member of club or organization: Not on file    Attends meetings of clubs or organizations: Not on file    Relationship status:  Not on file  Other Topics Concern  . Not on file  Social History Narrative  . Not on file   Outpatient Encounter Medications as of 07/12/2017  Medication Sig  . Acetaminophen (ARTHRITIS PAIN RELIEF PO) Take 650 mg by mouth 2 (two) times daily.  Marland Kitchen amLODipine (NORVASC) 5 MG tablet Take 5 mg by mouth daily.  Marland Kitchen aspirin 81 MG tablet Take 81 mg by mouth daily.  . brimonidine (ALPHAGAN) 0.2 % ophthalmic solution 2 (two) times daily.  . clopidogrel (PLAVIX) 75 MG tablet Take 75 mg by mouth daily.  . diphenhydrAMINE (BENADRYL) 25 MG tablet Take 25 mg by mouth at bedtime as needed.  . ezetimibe (ZETIA) 10 MG tablet Take 10 mg by mouth daily.  Marland Kitchen lisinopril (PRINIVIL,ZESTRIL) 20 MG tablet Take 20 mg by mouth daily.   . Multiple Vitamin (MULTIVITAMIN) tablet Take 1 tablet by mouth daily.  . pantoprazole (PROTONIX) 40 MG tablet Take 40 mg by mouth daily.  . pravastatin (PRAVACHOL) 20 MG tablet Every Mon, Wed, Fri  . pregabalin (LYRICA) 75 MG capsule Take 75 mg by mouth 2 (two) times daily.   No facility-administered encounter medications on file as of 07/12/2017.    ALLERGIES: Allergies  Allergen Reactions  . Amoxicillin     Nausea/weak/hot  . Carvedilol     Slowed heartrate   . Dilaudid [Hydromorphone Hcl] Nausea And Vomiting    Severe nausea and vomiting   . Doxycycline     Reaction unknown  . Fentanyl Nausea And Vomiting  . Hydrocodone     Nausea and vomiting  . Niaspan [Niacin Er]     Hot/sweaty  . Percocet [Oxycodone-Acetaminophen] Nausea And Vomiting  . Plavix [Clopidogrel Bisulfate]     Dark tarry stools  . Prednisone     Hard to breathe and weak  . Tequin [Gatifloxacin]     Reaction unknown    VACCINATION STATUS:  There is no immunization history on file for this patient.  HPI Gerald Jones is 84 y.o. male who presents today with a medical history as above. he is returning for a follow-up visit for hyponatremia  with repeat labs after he was consultation.   -He is accompanied by his elderly wife.  He is hard of hearing and a poor historian.  Review of his medical records reveals that he did have hyponatremia since at least 2014, June 13, 2012 his sodium level was 129, recent lowest sodium has been 125. -After recommended free water restrictions since his last visit, his sodium has improved to 133. -He has no new complaints. His recent laboratory work from May 06, 2017 reveals urine osmolality of 369, urine sodium of 62, serum osmolality of 260 (normal 273-295), and a.m. cortisol of 2.6, TSH of 2.34.  His sodium was 125, potassium 4.5, calcium 8.4. -  He denies nausea,  vomiting, diarrhea. -Further detailed interview patient during his first visit reveals that he consumes 4-6 bottles of water by the urging of his wife to maintain "hydration ".  He also consumes 2-4 bottles of Gatorade, 3-4 cups of coffee. -He denies xerostomia, polyuria, polydipsia. -He denies any history of head injury, malignancy, brain surgery.  Reportedly, he maintains stable weight over the years.  He reports good appetite, and physically active.  He is a former smoker, no history of COPD. -He denies any prior history of steroids requirement on long-term.  Review of  Systems  Constitutional: + Modestly fluctuating body weight, gained 4 pounds since last visit,  no fatigue,  no subjective hyperthermia, no subjective hypothermia Eyes: no blurry vision, no xerophthalmia ENT: no sore throat, no nodules palpated in throat, no dysphagia/odynophagia, no hoarseness Cardiovascular: no Chest Pain, no Shortness of Breath, no palpitations, no leg swelling Respiratory: no cough, no SOB Gastrointestinal: no Nausea/Vomiting/Diarhhea Musculoskeletal: no muscle/joint aches Skin: no rashes Neurological: no tremors, no numbness, no tingling, no dizziness Psychiatric: no depression, no anxiety  Objective:    BP (!) 143/68   Pulse (!) 54   Ht '5\' 6"'$  (1.676 m)   Wt 149 lb (67.6 kg)   BMI 24.05 kg/m   Wt Readings from Last 3 Encounters:  07/12/17 149 lb (67.6 kg)  06/21/17 145 lb (65.8 kg)  06/10/12 150 lb (68 kg)    Physical Exam  Constitutional: + Appropriate weight for his height, not in acute distress, normal state of mind, hard of hearing.    Eyes: PERRLA, EOMI, no exophthalmos ENT:  +moist mucous membranes, no thyromegaly, no cervical lymphadenopathy Musculoskeletal: + Diffuse arthritic changes in his upper extremities,  strength intact in all four extremities Skin: moist, warm, no rashes Neurological: no tremor with outstretched hands, Deep tendon reflexes normal in all four extremities.  Recent Results (from the past 2160 hour(s))  TSH     Status: None   Collection Time: 06/28/17  8:30 AM  Result Value Ref Range   TSH 2.698 0.350 - 4.500 uIU/mL    Comment: Performed by a 3rd Generation assay with a functional sensitivity of <=0.01 uIU/mL. Performed at [facility], [address], [place], [place] [address]   T4     Status: None   Collection Time: 06/28/17  8:30 AM  Result Value Ref Range   T4, Total 7.7 4.5 - 12.0 ug/dL    Comment: (NOTE) Performed At: [facility] [place], [place] [id number] Rush Farmer MD  QQ:[id number] Performed at [facility], [address], [place], [place] [address]   Comprehensive metabolic panel     Status: Abnormal   Collection Time: 06/28/17  8:30 AM  Result Value Ref Range   Sodium 133 (L) 135 - 145 mmol/L   Potassium 4.1 3.5 - 5.1 mmol/L   Chloride 97 (L) 101 - 111 mmol/L   CO2 26 22 - 32 mmol/L   Glucose, Bld 84 65 - 99 mg/dL   BUN 11 6 - 20 mg/dL   Creatinine, Ser 0.83 0.61 - 1.24 mg/dL   Calcium 9.3 8.9 - 10.3 mg/dL   Total Protein 7.2 6.5 - 8.1 g/dL   Albumin 4.4 3.5 - 5.0 g/dL   AST 23 15 - 41 U/L   ALT 20 17 - 63 U/L   Alkaline Phosphatase 50 38 - 126 U/L   Total Bilirubin 0.8 0.3 - 1.2 mg/dL   GFR calc non Af Amer >60 >60 mL/min   GFR calc Af Amer >60 >60 mL/min    Comment: (NOTE) The eGFR has been calculated using the CKD EPI equation. This calculation has not been validated in all clinical situations. eGFR's persistently <60 mL/min signify possible Chronic Kidney Disease.    Anion gap 10 5 - 15    Comment: Performed at [facility], [address], [place], [place] [address]  ACTH stimulation, 3 time points     Status: None   Collection Time: 06/28/17  9:00 AM  Result Value Ref Range   Cortisol, Base 13.9 ug/dL    Comment: NO NORMAL RANGE ESTABLISHED FOR THIS TEST   Cortisol, 30 Min 18.3 ug/dL  Cortisol, 60 Min 21.0 ug/dL    Comment: Performed at [facility], [place] [address], [place], [place] [address]     Assessment & Plan:   1. Hyponatremia -He has responded to free water restrictions with improved sodium of 133. - Based on review of his prior medical history , he has had  subacute to chronic hyponatremia at least since 2014.  His sodium level fluctuated between low of 125 and 134. 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   -The mainstay of therapy for SIADH is fluid restrictions.  I have advised patient to continue to cut free water intake by half, maintain his other fluid intake and repeat CMP in 1 week.  -His ACTH stimulation test is normal ruling out adrenal insufficiency, and his labs also rule out hypothyroidism. -In asymptomatic euvolemic patients with subacute to chronic hyponatremia, there is a lower hypothalamic osmostat than normal, hence,  correction of sodium to  "normal "range is unnecessary or sometimes complicates the clinical course for the patient.   -These patients function very well with low normal sodium level between 130-135, which will be the treatment target for him. -Suspicion for ectopic ADH is is low at this time.  -  I did not initiate any new prescriptions today. - I advised patient to maintain close follow up with Talmage Coin, MD for primary care needs.  I advised him to return in 1 year with CMP for reevaluation.  Follow up plan: Return in about 6 months (around 01/11/2018) for follow up with pre-visit labs.   Glade Lloyd, MD [facility] [address] [place], [place] [address] Phone: [phone]  Fax: [phone]     07/12/2017, 4:20 PM  This note was partially dictated with voice recognition software. Similar sounding words can be transcribed inadequately or may not  be corrected upon review.

## 2018-01-11 ENCOUNTER — Ambulatory Visit: Payer: Medicare Other | Admitting: "Endocrinology

## 2018-06-15 ENCOUNTER — Encounter (INDEPENDENT_AMBULATORY_CARE_PROVIDER_SITE_OTHER): Payer: Self-pay | Admitting: Orthopaedic Surgery

## 2018-06-15 ENCOUNTER — Ambulatory Visit (INDEPENDENT_AMBULATORY_CARE_PROVIDER_SITE_OTHER): Payer: Medicare Other

## 2018-06-15 ENCOUNTER — Other Ambulatory Visit: Payer: Self-pay

## 2018-06-15 ENCOUNTER — Ambulatory Visit (INDEPENDENT_AMBULATORY_CARE_PROVIDER_SITE_OTHER): Payer: Medicare Other | Admitting: Orthopaedic Surgery

## 2018-06-15 DIAGNOSIS — M5030 Other cervical disc degeneration, unspecified cervical region: Secondary | ICD-10-CM

## 2018-06-15 DIAGNOSIS — M545 Low back pain, unspecified: Secondary | ICD-10-CM

## 2018-06-15 DIAGNOSIS — G8929 Other chronic pain: Secondary | ICD-10-CM

## 2018-06-15 DIAGNOSIS — M4697 Unspecified inflammatory spondylopathy, lumbosacral region: Secondary | ICD-10-CM

## 2018-06-15 NOTE — Progress Notes (Signed)
Office Visit Note   Patient: Gerald Jones           Date of Birth: 1933-05-26           MRN: [id number] Visit Date: 06/15/2018              Requested by: Gerald Cordial, MD [address] [place], [place] [address] PCP: Gerald Cordial, MD   Assessment & Plan: Visit Diagnoses:  1. Chronic midline low back pain without sciatica   2. Other cervical disc degeneration, unspecified cervical region     Plan: Advanced degenerative changes in cervical and lumbar spine with probable radiculopathy involving both upper and lower extremities.  I suspect he may have cervical and lumbar stenosis.  Will order MRI scan of cervical spine as it appears to be the more symptomatic and return shortly thereafter.  The meantime continue with his over-the-counter medicines.  Might eventually need an MRI scan of the lumbar spine as well. Office visit over 30 minutes 50% of the time in counseling regarding chronic problems with his neck and lumbar spine and treatment options over time  Follow-Up Instructions: No follow-ups on file.   Orders:  Orders Placed This Encounter  Procedures  . XR Cervical Spine 2 or 3 views  . XR Lumbar Spine 2-3 Views  . MR Cervical Spine w/o contrast   No orders of the defined types were placed in this encounter.     Procedures: No procedures performed   Clinical Data: No additional findings.   Subjective: Chief Complaint  Patient presents with  . Spine - Pain  Patient presents today with pain from his head all the way down his spine. He said that it has hurt for years, but getting worse. He has a history of micodiscetomy lumbar laminectomy of right L3 with Dr.Yates in 2014. He said that his pain is worse upon prolonged standing. He feels better lying down. He also states that he has cramping in his feet. He takes tylenol for pain. Gerald Jones is 85 years old and relates a chronic history of neck and pain of the lumbar spine.  He relates he has had prior surgery of  the cervical spine many years ago.  Dr. Ophelia Jones performed an L3-4 discectomy on the right in 2014.  Gerald Jones relates that he has difficulty standing for any length of time as he will develop some pain in both shoulders and both arms with neck pain.  He also was having similar pain in his back but to a lesser extent.  He denies any bowel or bladder changes.  He is not had any abdominal complaints.  There is been no loss of appetite.  He finds that he tires easily and has to "sit down".  He is tried some hemp oil with relief and Tylenol.  HPI  Review of Systems   Objective: Vital Signs: There were no vitals taken for this visit.  Physical Exam Constitutional:      Appearance: He is well-developed.  Eyes:     Pupils: Pupils are equal, round, and reactive to light.  Pulmonary:     Effort: Pulmonary effort is normal.  Skin:    General: Skin is warm and dry.  Neurological:     Mental Status: He is alert and oriented to person, place, and time.  Psychiatric:        Behavior: Behavior normal.     Ortho Exam awake alert and oriented x3.  Comfortable sitting.  Does have some  limitation of cervical spine motion.  He is able to touch his chin to his chest but only about 60 degrees of normal neck extension.  He did not have any referred pain to either upper extremity with motion of the neck.  Rotation was about 70 degrees to the right into the left.  Reflexes were depressed but symmetrical.  Good grip and good release.  Painless range of motion of left shoulder without crepitation.  Might have some weakness of elevation of both shoulders without shoulder pain.  Mild tenderness in the posterior aspect of the cervical spine without masses  Straight leg raise was negative bilaterally.  Some very minimal percussible tenderness of lumbar spine.  Painless range of motion both hips and both knees.  Good lower extremity strength.  Reflexes were symmetrical but decreased  Specialty Comments:  No specialty  comments available.  Imaging: Xr Cervical Spine 2 Or 3 Views  Result Date: 06/15/2018 Films of the cervical spine were obtained in the AP and lateral projection.  There is significant degenerative disc disease throughout the cervical spine increased flexion at C3-4.  No acute change.  Diffuse facet arthropathy  Xr Lumbar Spine 2-3 Views  Result Date: 06/15/2018 Films of the lumbar spine were obtained in several projections.  There is no evidence of a curvature.  Sacroiliac joints appear to be intact.  The lateral film there is diffuse calcification of the abdominal aorta without obvious aneurysmal dilatation.  There is significant degenerative changes at the L4-5 and L5-S1 facet joints.  Narrowing of the L4-5 and L5-S1 disc spaces.  No acute changes    PMFS History: Patient Active Problem List   Diagnosis Date Noted  . Hyponatremia 06/21/2017  . HNP (herniated nucleus pulposus), lumbar 06/13/2012    Class: Diagnosis of   Past Medical History:  Diagnosis Date  . Arthritis   . Bronchitis   . Bruises easily   . Constipation    related to pain meds  . Coronary artery disease   . Family history of anesthesia complication    pts son gets amnesia for a few days after surgery  . GERD (gastroesophageal reflux disease)    takes Omeprazole daily  . Glaucoma    uses eye drops nightly  . Headache(784.0)   . Hemorrhoids   . History of blood transfusion    as a baby  . History of kidney stones   . Hyperlipidemia    takes Fish Oil daily--but on hold for surgery  . Hypertension    takes Lisinpril daily  . Insomnia    takes a night time sleep aid  . Peripheral neuropathy     Family History  Problem Relation Age of Onset  . Hypertension Mother     Past Surgical History:  Procedure Laterality Date  . basal cell removed from tip of nose  2011  . CARDIAC CATHETERIZATION  2003/2009  . CERVICAL DISC SURGERY  1992  . CORONARY ANGIOPLASTY     x 2  . CYSTOSCOPY    .  ESOPHAGOGASTRODUODENOSCOPY (EGD) WITH ESOPHAGEAL DILATION     x 3  . left shoulder surgery  1992  . left shoulder surgery in 2009    . LUMBAR LAMINECTOMY Right 06/13/2012   Procedure: MICRODISCECTOMY LUMBAR LAMINECTOMY;  Surgeon: Gerald Manges, MD;  Location: [facility] OR;  Service: Orthopedics;  Laterality: Right;  Right L3-4 Microdiscectomy  . SIGMOIDOSCOPY    . teeth extracted  late 60's  . VASECTOMY  late 28's   Social  History   Occupational History  . Not on file  Tobacco Use  . Smoking status: Former Smoker    Last attempt to quit: 07/01/1988    Years since quitting: 29.9  . Smokeless tobacco: Never Used  . Tobacco comment: quit in April 1990  Substance and Sexual Activity  . Alcohol use: No  . Drug use: No  . Sexual activity: Not Currently

## 2018-06-20 ENCOUNTER — Encounter: Payer: Self-pay | Admitting: Orthopaedic Surgery

## 2018-06-29 ENCOUNTER — Other Ambulatory Visit (INDEPENDENT_AMBULATORY_CARE_PROVIDER_SITE_OTHER): Payer: Self-pay | Admitting: Orthopaedic Surgery

## 2018-06-29 DIAGNOSIS — M5030 Other cervical disc degeneration, unspecified cervical region: Secondary | ICD-10-CM

## 2018-07-25 ENCOUNTER — Telehealth: Payer: Self-pay | Admitting: *Deleted

## 2018-07-25 NOTE — Telephone Encounter (Signed)
Gerald Jones from [facility] called, patient requesting PT for low back pain and hip. Approved by Dr. Cleophas Dunker. Claris Gower will send in order to [facility].
